# Patient Record
Sex: Male | Born: 1970 | Race: White | Hispanic: No | Marital: Married | Smoking: Current every day smoker
Health system: Southern US, Community
[De-identification: ages and names within clinical notes are randomized; demographics above are authoritative.]

## PROBLEM LIST (undated history)

## (undated) DIAGNOSIS — M1990 Unspecified osteoarthritis, unspecified site: Secondary | ICD-10-CM

## (undated) HISTORY — PX: NO PAST SURGERIES: SHX2092

---

## 2015-08-09 ENCOUNTER — Emergency Department (HOSPITAL_COMMUNITY): Payer: Worker's Compensation

## 2015-08-09 ENCOUNTER — Inpatient Hospital Stay (HOSPITAL_COMMUNITY): Payer: Worker's Compensation

## 2015-08-09 ENCOUNTER — Encounter (HOSPITAL_COMMUNITY): Payer: Self-pay | Admitting: Emergency Medicine

## 2015-08-09 ENCOUNTER — Inpatient Hospital Stay (HOSPITAL_COMMUNITY)
Admission: EM | Admit: 2015-08-09 | Discharge: 2015-08-11 | DRG: 544 | Disposition: A | Discharge status: Home Health | Payer: Worker's Compensation | Attending: Neurosurgery | Admitting: Neurosurgery

## 2015-08-09 DIAGNOSIS — Y9269 Other specified industrial and construction area as the place of occurrence of the external cause: Secondary | ICD-10-CM | POA: Diagnosis not present

## 2015-08-09 DIAGNOSIS — M4856XA Collapsed vertebra, not elsewhere classified, lumbar region, initial encounter for fracture: Principal | ICD-10-CM | POA: Diagnosis present

## 2015-08-09 DIAGNOSIS — W1789XA Other fall from one level to another, initial encounter: Secondary | ICD-10-CM | POA: Diagnosis present

## 2015-08-09 DIAGNOSIS — W19XXXA Unspecified fall, initial encounter: Secondary | ICD-10-CM

## 2015-08-09 DIAGNOSIS — S32000A Wedge compression fracture of unspecified lumbar vertebra, initial encounter for closed fracture: Secondary | ICD-10-CM

## 2015-08-09 DIAGNOSIS — S32010A Wedge compression fracture of first lumbar vertebra, initial encounter for closed fracture: Secondary | ICD-10-CM | POA: Diagnosis present

## 2015-08-09 DIAGNOSIS — Y990 Civilian activity done for income or pay: Secondary | ICD-10-CM | POA: Diagnosis not present

## 2015-08-09 HISTORY — DX: Unspecified osteoarthritis, unspecified site: M19.90

## 2015-08-09 LAB — I-STAT CHEM 8, ED
BUN: 9 mg/dL (ref 6–20)
CALCIUM ION: 1.11 mmol/L — AB (ref 1.12–1.23)
CHLORIDE: 99 mmol/L — AB (ref 101–111)
Creatinine, Ser: 0.9 mg/dL (ref 0.61–1.24)
GLUCOSE: 118 mg/dL — AB (ref 65–99)
HCT: 46 % (ref 39.0–52.0)
Hemoglobin: 15.6 g/dL (ref 13.0–17.0)
Potassium: 3.5 mmol/L (ref 3.5–5.1)
SODIUM: 139 mmol/L (ref 135–145)
TCO2: 25 mmol/L (ref 0–100)

## 2015-08-09 LAB — CBC WITH DIFFERENTIAL/PLATELET
Basophils Absolute: 0 10*3/uL (ref 0.0–0.1)
Basophils Relative: 0 %
EOS ABS: 0.1 10*3/uL (ref 0.0–0.7)
EOS PCT: 1 %
HCT: 42 % (ref 39.0–52.0)
HEMOGLOBIN: 14.3 g/dL (ref 13.0–17.0)
LYMPHS ABS: 2.9 10*3/uL (ref 0.7–4.0)
LYMPHS PCT: 34 %
MCH: 29 pg (ref 26.0–34.0)
MCHC: 34 g/dL (ref 30.0–36.0)
MCV: 85.2 fL (ref 78.0–100.0)
MONOS PCT: 5 %
Monocytes Absolute: 0.5 10*3/uL (ref 0.1–1.0)
Neutro Abs: 5.2 10*3/uL (ref 1.7–7.7)
Neutrophils Relative %: 60 %
PLATELETS: 250 10*3/uL (ref 150–400)
RBC: 4.93 MIL/uL (ref 4.22–5.81)
RDW: 13 % (ref 11.5–15.5)
WBC: 8.7 10*3/uL (ref 4.0–10.5)

## 2015-08-09 LAB — CREATININE, SERUM
Creatinine, Ser: 0.89 mg/dL (ref 0.61–1.24)
GFR calc Af Amer: >60 mL/min (ref >60)

## 2015-08-09 MED ORDER — HYDROMORPHONE HCL 1 MG/ML IJ SOLN
2.0000 mg | INTRAMUSCULAR | Status: AC
  Administered 2015-08-09: 2 mg via INTRAVENOUS
  Filled 2015-08-09: qty 2

## 2015-08-09 MED ORDER — HYDROMORPHONE HCL 1 MG/ML IJ SOLN: 1.0000 mg | Freq: Once | INTRAMUSCULAR | Status: DC

## 2015-08-09 MED ORDER — ONDANSETRON HCL 4 MG/2ML IJ SOLN
4.0000 mg | Freq: Once | INTRAMUSCULAR | Status: AC
  Administered 2015-08-09: 4 mg via INTRAVENOUS
  Filled 2015-08-09: qty 2

## 2015-08-09 MED ORDER — SODIUM CHLORIDE 0.9 % IJ SOLN: 3.0000 mL | INTRAMUSCULAR | Status: DC | PRN

## 2015-08-09 MED ORDER — DOCUSATE SODIUM 100 MG PO CAPS
100.0000 mg | ORAL_CAPSULE | Freq: Two times a day (BID) | ORAL | Status: DC
  Administered 2015-08-09 – 2015-08-11 (×4): 100 mg via ORAL
  Filled 2015-08-09 (×4): qty 1

## 2015-08-09 MED ORDER — DIAZEPAM 5 MG PO TABS
5.0000 mg | ORAL_TABLET | Freq: Four times a day (QID) | ORAL | Status: DC | PRN
  Administered 2015-08-10 – 2015-08-11 (×5): 5 mg via ORAL
  Filled 2015-08-09 (×5): qty 1

## 2015-08-09 MED ORDER — HYDROMORPHONE HCL 1 MG/ML IJ SOLN
2.0000 mg | Freq: Once | INTRAMUSCULAR | Status: AC
  Administered 2015-08-09: 2 mg via INTRAVENOUS
  Filled 2015-08-09: qty 2

## 2015-08-09 MED ORDER — ONDANSETRON HCL 4 MG/2ML IJ SOLN: 4.0000 mg | Freq: Four times a day (QID) | INTRAMUSCULAR | Status: DC | PRN

## 2015-08-09 MED ORDER — HYDROMORPHONE HCL 1 MG/ML IJ SOLN
1.0000 mg | Freq: Once | INTRAMUSCULAR | Status: AC
  Administered 2015-08-09: 1 mg via INTRAVENOUS
  Filled 2015-08-09: qty 1

## 2015-08-09 MED ORDER — HYDROMORPHONE HCL 1 MG/ML IJ SOLN
1.0000 mg | INTRAMUSCULAR | Status: DC | PRN
  Administered 2015-08-09 – 2015-08-11 (×6): 1 mg via INTRAVENOUS
  Filled 2015-08-09 (×6): qty 1

## 2015-08-09 MED ORDER — HEPARIN SODIUM (PORCINE) 5000 UNIT/ML IJ SOLN
5000.0000 [IU] | Freq: Three times a day (TID) | INTRAMUSCULAR | Status: DC
  Administered 2015-08-09 – 2015-08-11 (×6): 5000 [IU] via SUBCUTANEOUS
  Filled 2015-08-09 (×6): qty 1

## 2015-08-09 MED ORDER — IOHEXOL 300 MG/ML  SOLN
100.0000 mL | Freq: Once | INTRAMUSCULAR | Status: AC | PRN
  Administered 2015-08-09: 100 mL via INTRAVENOUS

## 2015-08-09 MED ORDER — MAGNESIUM CITRATE PO SOLN: 1.0000 | Freq: Once | ORAL | Status: DC | PRN

## 2015-08-09 MED ORDER — ACETAMINOPHEN 325 MG PO TABS: 650.0000 mg | ORAL_TABLET | Freq: Four times a day (QID) | ORAL | Status: DC | PRN

## 2015-08-09 MED ORDER — BISACODYL 5 MG PO TBEC: 5.0000 mg | DELAYED_RELEASE_TABLET | Freq: Every day | ORAL | Status: DC | PRN

## 2015-08-09 MED ORDER — ACETAMINOPHEN 650 MG RE SUPP: 650.0000 mg | Freq: Four times a day (QID) | RECTAL | Status: DC | PRN

## 2015-08-09 MED ORDER — SENNOSIDES-DOCUSATE SODIUM 8.6-50 MG PO TABS: 1.0000 | ORAL_TABLET | Freq: Every evening | ORAL | Status: DC | PRN

## 2015-08-09 MED ORDER — KETOROLAC TROMETHAMINE 30 MG/ML IJ SOLN
30.0000 mg | Freq: Four times a day (QID) | INTRAMUSCULAR | Status: DC
  Administered 2015-08-09 – 2015-08-11 (×8): 30 mg via INTRAVENOUS
  Filled 2015-08-09 (×9): qty 1

## 2015-08-09 MED ORDER — SODIUM CHLORIDE 0.9 % IJ SOLN
3.0000 mL | Freq: Two times a day (BID) | INTRAMUSCULAR | Status: DC
  Administered 2015-08-09 – 2015-08-10 (×2): 3 mL via INTRAVENOUS

## 2015-08-09 MED ORDER — SODIUM CHLORIDE 0.9 % IV SOLN: 250.0000 mL | INTRAVENOUS | Status: DC | PRN

## 2015-08-09 MED ORDER — ONDANSETRON HCL 4 MG PO TABS: 4.0000 mg | ORAL_TABLET | Freq: Four times a day (QID) | ORAL | Status: DC | PRN

## 2015-08-09 MED ORDER — OXYCODONE HCL 5 MG PO TABS
5.0000 mg | ORAL_TABLET | ORAL | Status: DC | PRN
  Administered 2015-08-09 – 2015-08-11 (×8): 5 mg via ORAL
  Filled 2015-08-09 (×8): qty 1

## 2015-08-09 MED ORDER — FAMOTIDINE 20 MG PO TABS
20.0000 mg | ORAL_TABLET | Freq: Every day | ORAL | Status: DC
  Administered 2015-08-09 – 2015-08-10 (×2): 20 mg via ORAL
  Filled 2015-08-09 (×2): qty 1

## 2015-08-09 MED ORDER — POTASSIUM CHLORIDE IN NACL 20-0.9 MEQ/L-% IV SOLN
INTRAVENOUS | Status: DC
  Administered 2015-08-09 – 2015-08-10 (×2): via INTRAVENOUS
  Filled 2015-08-09 (×3): qty 1000

## 2015-08-09 NOTE — Progress Notes (Signed)
Patient admitted to room 5M01 from ED at this time. Alert and in stable condition.

## 2015-08-09 NOTE — ED Notes (Signed)
Patient transported to CT 

## 2015-08-09 NOTE — ED Provider Notes (Signed)
CSN: [id number]     Arrival date & time 08/09/15  1327 History   First MD Initiated Contact with Patient 08/09/15 1323     Chief Complaint  Patient presents with  . Fall     (Consider location/radiation/quality/duration/timing/severity/associated sxs/prior Treatment) Patient is a 44 y.o. male presenting with fall. The history is provided by the patient.  Fall Pertinent negatives include no chest pain and no abdominal pain.   patient came in after fall. 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 Reportedly get hit by a beam moving across that knocked him off ladder Landed on his lower back on a horizontal bar. Severe pain in his lower back. States did not his head. No neck pain. No numbness weakness. The pain is severe. No loss of bladder or bowel control. He has had some mild back pains in the past but nothing like this. He is not on anticoagulation.  Past Medical History  Diagnosis Date  . Arthritis    History reviewed. No pertinent past surgical history. No family history on file. Social History  Substance Use Topics  . Smoking status: None  . Smokeless tobacco: None  . Alcohol Use: None    Review of Systems  Constitutional: Negative for appetite change.  Cardiovascular: Negative for chest pain.  Gastrointestinal: Negative for abdominal pain.  Genitourinary: Negative for flank pain.  Musculoskeletal: Positive for back pain.  Skin: Negative for wound.  Neurological: Negative for weakness and numbness.      Allergies  Review of patient's allergies indicates no known allergies.  Home Medications   Prior to Admission medications   Not on File   BP 148/102 mmHg  Pulse 67  Temp(Src) 97.9 F (36.6 C) (Oral)  Resp 27  Ht 5\' 7"  (1.702 m)  Wt 155 lb (70.308 kg)  BMI 24.27 kg/m2  SpO2 100% Physical Exam  Constitutional: He appears well-developed.  HENT:  Head: Atraumatic.  Cardiovascular: Normal rate.   Pulmonary/Chest: Effort  normal.  Abdominal: Soft.  Musculoskeletal: Normal range of motion. He exhibits no edema.   No cervical spine tenderness. Painless range of motion. Moderate-to-severe tenderness over lower back. Good strength in both lower legs. Pain in lower back with movement of the legs.  Skin: Skin is warm.    ED Course  Procedures (including critical care time) Labs Review Labs Reviewed - No data to display  Imaging Review No results found. I have personally reviewed and evaluated these images and lab results as part of my medical decision-making.   EKG Interpretation None      MDM   Final diagnoses:  None     patient with fall. Landed on his lower back on the floor. Has apparent 33% lumbar compression fracture and some retropulsion. Neurologically is able to move all his extremities. He does have pain in the back with it. Perineal sensations intact. Good flexion-extension of the ankle and knees. Discussed with or nurse for Dr. Franky Machoabbell.   I will add on CT scans of the cervical spine and thoracic spine stenoses were not imaged.. Will be seen by Dr. Mikal Planeabell in the ER. Patient is nothing by mouth and has not eaten today.    Benjiman CoreNathan Sherrika Weakland, MD 08/09/15 1540

## 2015-08-09 NOTE — ED Notes (Signed)
Patient undressed, in gown, on monitor, continuous pulse oximetry and blood pressure cuff 

## 2015-08-09 NOTE — ED Notes (Signed)
Per EMS, pt was on a 6 foot ladder when he fell onto a horizontal circular beam striking his lower back. Pt alert x4.

## 2015-08-09 NOTE — H&P (Signed)
Jesse Burns is an 44 y.o. male.   Chief Complaint: back pain, l1 compression fracture HPI: whom was knocked off a ladder while at work, striking a steel beam on his way down. Complained of back pain immediately and brought to the ED. CT showed an L1 compression fracture with minimal retropulsion. Neurologically no deficits were noted.  Past Medical History  Diagnosis Date  . Arthritis     History reviewed. No pertinent past surgical history.  No family history on file. Social History:  has no tobacco, alcohol, and drug history on file.  Allergies: No Known Allergies   (Not in a hospital admission)  Results for orders placed or performed during the hospital encounter of 08/09/15 (from the past 48 hour(s))  CBC with Differential     Status: None   Collection Time: 08/09/15  4:02 PM  Result Value Ref Range   WBC 8.7 4.0 - 10.5 K/uL   RBC 4.93 4.22 - 5.81 MIL/uL   Hemoglobin 14.3 13.0 - 17.0 g/dL   HCT 16.1 09.6 - 04.5 %   MCV 85.2 78.0 - 100.0 fL   MCH 29.0 26.0 - 34.0 pg   MCHC 34.0 30.0 - 36.0 g/dL   RDW 40.9 81.1 - 91.4 %   Platelets 250 150 - 400 K/uL   Neutrophils Relative % 60 %   Neutro Abs 5.2 1.7 - 7.7 K/uL   Lymphocytes Relative 34 %   Lymphs Abs 2.9 0.7 - 4.0 K/uL   Monocytes Relative 5 %   Monocytes Absolute 0.5 0.1 - 1.0 K/uL   Eosinophils Relative 1 %   Eosinophils Absolute 0.1 0.0 - 0.7 K/uL   Basophils Relative 0 %   Basophils Absolute 0.0 0.0 - 0.1 K/uL  I-stat Chem 8, ED     Status: Abnormal   Collection Time: 08/09/15  4:05 PM  Result Value Ref Range   Sodium 139 135 - 145 mmol/L   Potassium 3.5 3.5 - 5.1 mmol/L   Chloride 99 (L) 101 - 111 mmol/L   BUN 9 6 - 20 mg/dL   Creatinine, Ser 7.82 0.61 - 1.24 mg/dL   Glucose, Bld 956 (H) 65 - 99 mg/dL   Calcium, Ion 2.13 (L) 1.12 - 1.23 mmol/L   TCO2 25 0 - 100 mmol/L   Hemoglobin 15.6 13.0 - 17.0 g/dL   HCT 08.6 57.8 - 46.9 %   Ct Cervical Spine Wo Contrast  08/09/2015  CLINICAL DATA:  44 year old  who was standing on a ladder approximately 6 feet off of the ground, struck by a bean which caused him to fall off of the ladder, landing on his lower back on a horizontal bar. Acute L1 compression fracture on earlier lumbar spine CT. EXAM: CT CERVICAL SPINE WITHOUT CONTRAST CT THORACIC SPINE WITHOUT CONTRAST TECHNIQUE: Multidetector CT imaging of the cervical and thoracic spine was performed without contrast. Multiplanar CT image reconstructions were also generated. COMPARISON:  None. FINDINGS: CT CERVICAL SPINE FINDINGS No fractures identified involving the cervical spine. Sagittal reconstructed images demonstrate anatomic alignment. Mild disc space narrowing at C4-5 with mild central disc protrusion. Minimal central disc protrusion at C3-4 and C5-6. Facet joints intact throughout. No spinal stenosis. Coronal reformatted images demonstrate an intact craniocervical junction, intact C1-C2 articulation, intact dens, and intact lateral masses throughout. Uncinate hypertrophy accounts for moderate bilateral foraminal stenoses at C4-5, with the remaining neural foramina widely patent. CT THORACIC SPINE FINDINGS No fractures identified involving the thoracic spine. Several reconstructed images demonstrate anatomic alignment. Mild disc  space narrowing at multiple levels of the lower thoracic spine. Calcification in the anterior annular fibers at T9-10 and T11-12. No spinal stenosis. Likely postsurgical dystrophic calcification involving the posterior mediastinum on the left, posterior to the esophagus. 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 No acute fractures identified involving the visualized posterior ribs. IMPRESSION: 1. No acute fractures identified involving the cervical or thoracic spine. 2. 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 No acute fractures identified involving the visualized posterior ribs. Electronically Signed   By: Hulan Saas M.D.   On: 08/09/2015 17:07   Ct Thoracic Spine Wo Contrast  08/09/2015  CLINICAL DATA:  44 year old who was standing on a ladder approximately 6 feet off of the ground, struck by a bean which caused him to fall off of the ladder, landing on his lower back on a horizontal bar. Acute L1 compression fracture on earlier lumbar spine CT. EXAM: CT CERVICAL SPINE WITHOUT CONTRAST CT THORACIC SPINE WITHOUT CONTRAST TECHNIQUE: Multidetector CT imaging of the cervical and thoracic spine was performed without contrast. Multiplanar CT image reconstructions were also generated. COMPARISON:  None. FINDINGS: CT CERVICAL SPINE FINDINGS No fractures identified involving the cervical spine. Sagittal reconstructed images demonstrate anatomic alignment. Mild disc space narrowing at C4-5 with mild central disc protrusion. Minimal central disc protrusion at C3-4 and C5-6. Facet joints intact throughout. No spinal stenosis. Coronal reformatted images demonstrate an intact craniocervical junction, intact C1-C2 articulation, intact dens, and intact lateral masses throughout. Uncinate hypertrophy accounts for moderate bilateral foraminal stenoses at C4-5, with the remaining neural foramina widely patent. CT THORACIC SPINE FINDINGS No fractures identified involving the thoracic spine. Several reconstructed images demonstrate anatomic alignment. Mild disc space narrowing at multiple levels of the lower thoracic spine. Calcification in the anterior annular fibers at T9-10 and T11-12. No spinal stenosis. Likely postsurgical dystrophic calcification involving the posterior mediastinum on the left, posterior to the esophagus. 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 No acute fractures identified involving the visualized posterior ribs. IMPRESSION: 1. No acute fractures identified involving the cervical or thoracic spine. 2. Old likely old thoracotomy deformity involving the posterior left seventh  rib. No acute fractures identified involving the visualized posterior ribs. Electronically Signed   By: Hulan Saas M.D.   On: 08/09/2015 17:07   Ct Lumbar Spine Wo Contrast  08/09/2015  CLINICAL DATA:  44 year old male status post 6 foot fall landing on a steel beam. Severe lumbar spine pain. EXAM: CT LUMBAR SPINE WITHOUT CONTRAST TECHNIQUE: Multidetector CT imaging of the lumbar spine was performed without intravenous contrast administration. Multiplanar CT image reconstructions were also generated. COMPARISON:  Concurrently obtained pelvic x-ray Ing FINDINGS: There is an acute compression fracture involving the superior endplate of L1 with approximately 33% height loss anteriorly. There is mild posterior bony retropulsion of approximately 4 mm. This results in some effacement of the anterior CSF space. No evidence of posterior element involvement or fracture at any additional level. Mild perivertebral hematoma. No significant degenerative disc disease or facet arthropathy. The visualized intra-abdominal and pelvic contents are unremarkable. IMPRESSION: Positive for acute compression fracture of the superior endplate of L1 resulting in approximately 33% height loss anteriorly and with posterior bony retropulsion of approximately 4 mm. Electronically Signed   By: Malachy Moan M.D.   On: 08/09/2015 15:10   Dg Pelvis Portable  08/09/2015  CLINICAL DATA:  Fall off of an 8 foot ladder landing on a metal rod. Lower  lumbar and upper pelvic pain posteriorly. Initial encounter. EXAM: PORTABLE PELVIS 1-2 VIEWS COMPARISON:  None. FINDINGS: There is no evidence of pelvic fracture or diastasis. No pelvic bone lesions are seen. IMPRESSION: Negative. Electronically Signed   By: Sebastian AcheAllen  Grady M.D.   On: 08/09/2015 13:56   Dg Chest Portable 1 View  08/09/2015  CLINICAL DATA:  44 year old who was standing on a ladder approximately 6 feet off the ground, struck by a beam which caused him to fall off of the  ladder, landing on his lower back on a horizontal bar. Severe low back pain with acute L1 compression fracture on CT. Mild shortness of breath. Initial encounter. EXAM: PORTABLE CHEST 1 VIEW COMPARISON:  None. FINDINGS: Cardiomediastinal silhouette unremarkable for AP portable technique. Elevation of right hemidiaphragm with linear scar/atelectasis at the right lung base. Mild linear atelectasis at the left lung base. Lungs otherwise clear. No localized airspace consolidation. No pleural effusions. No pneumothorax. Normal pulmonary vascularity. IMPRESSION: Elevation of the right hemidiaphragm with scar/atelectasis at the right lung base. Mild linear atelectasis at the left base. No acute cardiopulmonary disease otherwise. A Electronically Signed   By: Hulan Saashomas  Lawrence M.D.   On: 08/09/2015 16:04    Review of Systems  Constitutional: Negative.   HENT: Negative.   Eyes: Negative.   Respiratory: Negative.   Cardiovascular: Negative.   Gastrointestinal: Negative.   Genitourinary: Negative.   Musculoskeletal: Positive for back pain.  Skin: Negative.   Neurological: Negative.   Endo/Heme/Allergies: Negative.   Psychiatric/Behavioral: Negative.     Blood pressure 129/88, pulse 84, temperature 97.9 F (36.6 C), temperature source Oral, resp. rate 17, height 5\' 7"  (1.702 m), weight 70.308 kg (155 lb), SpO2 95 %. Physical Exam  Constitutional: He is oriented to person, place, and time. He appears well-developed and well-nourished. He appears distressed.  HENT:  Head: Normocephalic and atraumatic.  Poor dentition  Eyes: Conjunctivae and EOM are normal. Pupils are equal, round, and reactive to light.  Neck: Normal range of motion. Neck supple.  Cardiovascular: Normal rate, regular rhythm, normal heart sounds and intact distal pulses.   Respiratory: Effort normal and breath sounds normal.  GI: There is tenderness.  Abdominal wall musculature spasming  Musculoskeletal: Normal range of motion. He  exhibits tenderness.  Neurological: He is alert and oriented to person, place, and time. He has normal reflexes. He displays normal reflexes. No cranial nerve deficit. He exhibits normal muscle tone. Coordination normal.  Skin: Skin is warm and dry.  Psychiatric: He has a normal mood and affect. His behavior is normal. Judgment and thought content normal.     Assessment/Plan L1 compression fracture without neurologic compromise Admit for pain control.  ED ordered ct abdomen secondary to abdominal wall tenderness.   Corynn Solberg L 08/09/2015, 5:26 PM

## 2015-08-09 NOTE — ED Provider Notes (Signed)
Assumed care from Dr. Rubin PayorPickering. Patient fell off of a ladder about 6 feet off the ground and hit by a beam in his low back. Has a compression fracture of his lumbar spine. Awaiting neurosurgery evaluation. No weakness in his legs.   Dr. Mikal Planeabell has seen patient. He'll admit for pain control. Patient with significant amount of abdominal tenderness on exam. Given his mechanism would pursue additional trauma imaging.  These were negative for additional traumatic pathology.  Jesse OctaveStephen Katerina Zurn, MD 08/09/15 [phone]

## 2015-08-10 ENCOUNTER — Encounter (HOSPITAL_COMMUNITY): Payer: Self-pay | Admitting: *Deleted

## 2015-08-10 MED ORDER — OXYCODONE-ACETAMINOPHEN 5-325 MG PO TABS: 1.0000 | ORAL_TABLET | Freq: Four times a day (QID) | ORAL | Status: AC | PRN

## 2015-08-10 MED ORDER — CYCLOBENZAPRINE HCL 10 MG PO TABS: 10.0000 mg | ORAL_TABLET | Freq: Three times a day (TID) | ORAL | Status: AC | PRN

## 2015-08-10 MED ORDER — DM-GUAIFENESIN ER 30-600 MG PO TB12
1.0000 | ORAL_TABLET | Freq: Two times a day (BID) | ORAL | Status: DC
  Administered 2015-08-10 – 2015-08-11 (×2): 1 via ORAL
  Filled 2015-08-10 (×2): qty 1

## 2015-08-10 NOTE — Discharge Instructions (Addendum)
Lumbar Fracture A lumbar fracture is a break in one of the bones of the lower back. Lumbar fractures range in severity. Severe fractures can damage the spinal cord. CAUSES This condition may be caused by:  A fall (common).  A car accident (common).  A gunshot wound.  A hard, direct hit to the back.  Osteoporosis. SYMPTOMS The main symptom of this condition is severe pain in the lower back. If a fracture is complex or severe, there may also be:  A misshapen or swollen area on the lower back.  A limited ability to move an area of the lower back.  An inability to empty the bladder or bowel.  A loss of strength or sensation in the legs, feet, and toes.  Paralysis. DIAGNOSIS This condition is diagnosed based on:  A physical exam.  Symptoms and what happened just before they developed.  The results of imaging tests, such as an X-ray, CT scan, or MRI. If your nerves have been damaged, you may also have other tests to find out how much damage there is. TREATMENT Treatment for this condition depends on the specifics of the injury. Most fractures can be treated with:  A back brace.  Bed rest and activity restrictions.  Pain medicine.  Physical therapy. Fractures that are complex, involve multiple bones, or make the spine unstable may require surgery to remove pressure from the nerves or spinal cord and to stabilize the broken pieces of bone. During recovery, it is normal to have pain and stiffness in the back for weeks. HOME CARE INSTRUCTIONS Medicines  Take medicines only as directed by your health care provider.  Do not drive or operate heavy machinery while taking pain medicine. Activity  Stay in bed for as long as directed by your health care provider.  If you were shown how to do any exercises to improve motion and strength in your back, do them as directed by your health care provider.  Return to your normal activities as directed by your health care provider.  Ask your health care provider what activities are safe for you. General Instructions  If you were given a neck brace or back brace, wear it as directed by your health care provider.  Keep all follow-up visits as directed by your health care provider. This is important. Failure to follow-up as recommended could result in permanent injury, disability, and long-lasting (chronic) pain. SEEK MEDICAL CARE IF:  Your pain does not improve over time.  You have a persistent cough.  You cannot return to your normal activities as planned or expected. SEEK IMMEDIATE MEDICAL CARE IF:  You have severe pain or your pain suddenly gets worse.  You are unable to move.  You have numbness, tingling, weakness, or paralysis in any part of your body.  You cannot control your bladder or bowel.  You have difficulty breathing.  You have a fever.  You have pain in your chest or abdomen.  You vomit.   This information is not intended to replace advice given to you by your health care provider. Make sure you discuss any questions you have with your health care provider.   Document Released: 11/22/2006 Document Revised: 12/22/2014 Document Reviewed: 08/03/2014 Elsevier Interactive Patient Education 2016 [employer].  Constipation, Adult Constipation is when a person has fewer than three bowel movements a week, has difficulty having a bowel movement, or has stools that are dry, hard, or larger than normal. As people grow older, constipation is more common. A low-fiber diet,  not taking in enough fluids, and taking certain medicines may make constipation worse.  CAUSES   Certain medicines, such as antidepressants, pain medicine, iron supplements, antacids, and water pills.   Certain diseases, such as diabetes, irritable bowel syndrome (IBS), thyroid disease, or depression.   Not drinking enough water.   Not eating enough fiber-rich foods.   Stress or travel.   Lack of physical activity or  exercise.   Ignoring the urge to have a bowel movement.   Using laxatives too much.  SIGNS AND SYMPTOMS   Having fewer than three bowel movements a week.   Straining to have a bowel movement.   Having stools that are hard, dry, or larger than normal.   Feeling full or bloated.   Pain in the lower abdomen.   Not feeling relief after having a bowel movement.  DIAGNOSIS  Your health care provider will take a medical history and perform a physical exam. Further testing may be done for severe constipation. Some tests may include:  A barium enema X-ray to examine your rectum, colon, and, sometimes, your small intestine.   A sigmoidoscopy to examine your lower colon.   A colonoscopy to examine your entire colon. TREATMENT  Treatment will depend on the severity of your constipation and what is causing it. Some dietary treatments include drinking more fluids and eating more fiber-rich foods. Lifestyle treatments may include regular exercise. If these diet and lifestyle recommendations do not help, your health care provider may recommend taking over-the-counter laxative medicines to help you have bowel movements. Prescription medicines may be prescribed if over-the-counter medicines do not work.  HOME CARE INSTRUCTIONS   Eat foods that have a lot of fiber, such as fruits, vegetables, whole grains, and beans.  Limit foods high in fat and processed sugars, such as french fries, hamburgers, cookies, candies, and soda.   A fiber supplement may be added to your diet if you cannot get enough fiber from foods.   Drink enough fluids to keep your urine clear or pale yellow.   Exercise regularly or as directed by your health care provider.   Go to the restroom when you have the urge to go. Do not hold it.   Only take over-the-counter or prescription medicines as directed by your health care provider. Do not take other medicines for constipation without talking to your health  care provider first.  SEEK IMMEDIATE MEDICAL CARE IF:   You have bright red blood in your stool.   Your constipation lasts for more than 4 days or gets worse.   You have abdominal or rectal pain.   You have thin, pencil-like stools.   You have unexplained weight loss. MAKE SURE YOU:   Understand these instructions.  Will watch your condition.  Will get help right away if you are not doing well or get worse.   This information is not intended to replace advice given to you by your health care provider. Make sure you discuss any questions you have with your health care provider.   Document Released: 05/05/2004 Document Revised: 08/28/2014 Document Reviewed: 05/19/2013 Elsevier Interactive Patient Education 2016 Elsevier Inc[employer]  Acetaminophen; Oxycodone tablets What is this medicine? ACETAMINOPHEN; OXYCODONE (a set a MEE noe fen; ox i KOE done) is a pain reliever. It is used to treat moderate to severe pain. This medicine may be used for other purposes; ask your health care provider or pharmacist if you have questions. What should I tell my health care provider before I take  this medicine? They need to know if you have any of these conditions: -brain tumor -Crohn's disease, inflammatory bowel disease, or ulcerative colitis -drug abuse or addiction -head injury -heart or circulation problems -if you often drink alcohol -kidney disease or problems going to the bathroom -liver disease -lung disease, asthma, or breathing problems -an unusual or allergic reaction to acetaminophen, oxycodone, other opioid analgesics, other medicines, foods, dyes, or preservatives -pregnant or trying to get pregnant -breast-feeding How should I use this medicine? Take this medicine by mouth with a full glass of water. Follow the directions on the prescription label. You can take it with or without food. If it upsets your stomach, take it with food. Take your medicine at regular intervals. Do not  take it more often than directed. Talk to your pediatrician regarding the use of this medicine in children. Special care may be needed. Patients over 61 years old may have a stronger reaction and need a smaller dose. Overdosage: If you think you have taken too much of this medicine contact a poison control center or emergency room at once. NOTE: This medicine is only for you. Do not share this medicine with others. What if I miss a dose? If you miss a dose, take it as soon as you can. If it is almost time for your next dose, take only that dose. Do not take double or extra doses. What may interact with this medicine? -alcohol -antihistamines -barbiturates like amobarbital, butalbital, butabarbital, methohexital, pentobarbital, phenobarbital, thiopental, and secobarbital -benztropine -drugs for bladder problems like solifenacin, trospium, oxybutynin, tolterodine, hyoscyamine, and methscopolamine -drugs for breathing problems like ipratropium and tiotropium -drugs for certain stomach or intestine problems like propantheline, homatropine methylbromide, glycopyrrolate, atropine, belladonna, and dicyclomine -general anesthetics like etomidate, ketamine, nitrous oxide, propofol, desflurane, enflurane, halothane, isoflurane, and sevoflurane -medicines for depression, anxiety, or psychotic disturbances -medicines for sleep -muscle relaxants -naltrexone -narcotic medicines (opiates) for pain -phenothiazines like perphenazine, thioridazine, chlorpromazine, mesoridazine, fluphenazine, prochlorperazine, promazine, and trifluoperazine -scopolamine -tramadol -trihexyphenidyl This list may not describe all possible interactions. Give your health care provider a list of all the medicines, herbs, non-prescription drugs, or dietary supplements you use. Also tell them if you smoke, drink alcohol, or use illegal drugs. Some items may interact with your medicine. What should I watch for while using this  medicine? Tell your doctor or health care professional if your pain does not go away, if it gets worse, or if you have new or a different type of pain. You may develop tolerance to the medicine. Tolerance means that you will need a higher dose of the medication for pain relief. Tolerance is normal and is expected if you take this medicine for a long time. Do not suddenly stop taking your medicine because you may develop a severe reaction. Your body becomes used to the medicine. This does NOT mean you are addicted. Addiction is a behavior related to getting and using a drug for a non-medical reason. If you have pain, you have a medical reason to take pain medicine. Your doctor will tell you how much medicine to take. If your doctor wants you to stop the medicine, the dose will be slowly lowered over time to avoid any side effects. You may get drowsy or dizzy. Do not drive, use machinery, or do anything that needs mental alertness until you know how this medicine affects you. Do not stand or sit up quickly, especially if you are an older patient. This reduces the risk of dizzy or fainting  spells. Alcohol may interfere with the effect of this medicine. Avoid alcoholic drinks. There are different types of narcotic medicines (opiates) for pain. If you take more than one type at the same time, you may have more side effects. Give your health care provider a list of all medicines you use. Your doctor will tell you how much medicine to take. Do not take more medicine than directed. Call emergency for help if you have problems breathing. The medicine will cause constipation. Try to have a bowel movement at least every 2 to 3 days. If you do not have a bowel movement for 3 days, call your doctor or health care professional. Do not take Tylenol (acetaminophen) or medicines that have acetaminophen with this medicine. Too much acetaminophen can be very dangerous. Many nonprescription medicines contain acetaminophen. Always  read the labels carefully to avoid taking more acetaminophen. What side effects may I notice from receiving this medicine? Side effects that you should report to your doctor or health care professional as soon as possible: -allergic reactions like skin rash, itching or hives, swelling of the face, lips, or tongue -breathing difficulties, wheezing -confusion -light headedness or fainting spells -severe stomach pain -unusually weak or tired -yellowing of the skin or the whites of the eyes Side effects that usually do not require medical attention (report to your doctor or health care professional if they continue or are bothersome): -dizziness -drowsiness -nausea -vomiting This list may not describe all possible side effects. Call your doctor for medical advice about side effects. You may report side effects to FDA at 1-800-FDA-1088. Where should I keep my medicine? Keep out of the reach of children. This medicine can be abused. Keep your medicine in a safe place to protect it from theft. Do not share this medicine with anyone. Selling or giving away this medicine is dangerous and against the law. This medicine may cause accidental overdose and death if it taken by other adults, children, or pets. Mix any unused medicine with a substance like cat litter or coffee grounds. Then throw the medicine away in a sealed container like a sealed bag or a coffee can with a lid. Do not use the medicine after the expiration date. Store at room temperature between 20 and 25 degrees C (68 and 77 degrees F). NOTE: This sheet is a summary. It may not cover all possible information. If you have questions about this medicine, talk to your doctor, pharmacist, or health care provider.    2016, Elsevier/Gold Standard. (2014-07-08 15:18:46)

## 2015-08-10 NOTE — Discharge Summary (Signed)
  Physician Discharge Summary  Patient ID: Jesse Burns MRN: [id number] DOB/AGE: March 27, 1971 44 y.o.  Admit date: 08/09/2015 Discharge date: 08/10/2015  Admission Diagnoses: Jesse Burns, Jesse Burns Discharge Diagnoses:  Active Problems:   Compression Burns of first lumbar vertebra [facility](HCC)   Discharged Condition: good  Hospital Course: Jesse Burns was admitted after falling while at work yesterday resulting in a compression Burns of Jesse. He has some mild retropulsion, the posterior elements are intact. He was in significant pain and was admitted for pain control. He is doing well with oral medication for pain. He is voiding, and tolerating a regular diet. He will be sent home with a brace.   Treatments: bracing  Discharge Exam: Blood pressure 123/78, pulse 98, temperature 99.8 F (37.7 C), temperature source Oral, resp. rate 20, height 5\' 7"  (1.702 m), weight 70.308 kg (155 lb), SpO2 96 %. General appearance: alert, cooperative, appears stated age and mild distress Neurologic: Alert and oriented X 3, normal strength and tone. Normal symmetric reflexes. Normal coordination and gait  Disposition: Final discharge disposition not confirmed * Jesse surgery found *    Medication List    STOP taking these medications        traMADol 50 MG tablet  Commonly known as:  ULTRAM      TAKE these medications        cyclobenzaprine 10 MG tablet  Commonly known as:  FLEXERIL  Take 1 tablet (10 mg total) by mouth 3 (three) times daily as needed for muscle spasms.     HYDROcodone-acetaminophen 5-325 MG tablet  Commonly known as:  NORCO/VICODIN  Take 1 tablet by mouth every 6 (six) hours as needed for moderate pain.     oxyCODONE-acetaminophen 5-325 MG tablet  Commonly known as:  ROXICET  Take 1 tablet by mouth every 6 (six) hours as needed for severe pain.     ranitidine 150 MG tablet  Commonly known as:  ZANTAC  Take 150 mg by mouth daily as needed for  heartburn.           Follow-up Information    Follow up with Jesse Maselli Burns, Jesse Burns In 3 weeks.   Specialty:  Neurosurgery   Why:  call the office please to make an appointment   Contact information:   [address] [place] [address] [phone]       Signed: Carmela Burns,Jesse Burns 08/10/2015, 7:18 PM

## 2015-08-10 NOTE — Progress Notes (Signed)
Patient ID: Jesse GravesChris Alpern, male   DOB: [birth year], 44 y.o.   MRN: [id number] BP 123/78 mmHg  Pulse 98  Temp(Src) 99.8 F (37.7 C) (Oral)  Resp 20  Ht 5\' 7"  (1.702 m)  Wt 70.308 kg (155 lb)  BMI 24.27 kg/m2  SpO2 96% Still with a great deal of pain. Has needed dilaudid this afternoon for pain control. Will not discharge tonight. Pt can work with him tomorrow with the brace on.  Moving lower extremities well.

## 2015-08-10 NOTE — Progress Notes (Signed)
Utilization review completed. Baylee Mccorkel, RN, BSN. 

## 2015-08-11 ENCOUNTER — Encounter (HOSPITAL_COMMUNITY): Payer: Self-pay | Admitting: *Deleted

## 2015-08-11 NOTE — Progress Notes (Signed)
RN discussed discharge instructions with patient. RN discussed constipation preventions, pain medication and pain control, follow up appointments, and brace use. Patient and family vocalized understanding. Per patient, Dr. Franky Machoabbell gave him prescribed prescriptions. Patient rates pain a 6/10 in back, states this is manageable. Neuro assessment unchanged. IV removed. Patient escorted down to emergency room exit by StrathmoreNickie, NT.

## 2015-08-11 NOTE — Progress Notes (Addendum)
[employer] paged to order aspen lumbar with thoracic extension. [employer] to deliver brace.

## 2015-08-11 NOTE — Progress Notes (Signed)
Orthopedic Tech Progress Note Patient Details:  Jesse GravesChris Burns Sep 04, 1970 [id number]  Patient ID: Jesse GravesChris Lavergne, male   DOB: Sep 04, 1970, 44 y.o.   MRN: [id number] Called in [employer] brace order; spoke with Richardean Chimeraathy  [name] 08/11/2015, 9:10 AM

## 2015-08-11 NOTE — Evaluation (Signed)
Physical Therapy Evaluation Patient Details Name: Jesse GravesChris Treichler MRN: [id number] DOB: 04/07/1971 Today's Date: 08/11/2015   History of Present Illness  whom was knocked off a ladder while at work, striking a steel beam on his way down. Complained of back pain immediately and brought to the ED. CT showed an L1 compression fracture with minimal retropulsion  Clinical Impression  Patient presents with decreased mobility due to deficits listed in PT problem list.  He will benefit from skilled PT in the acute setting to allow return home with family support and HHPT follow up.    Follow Up Recommendations Home health PT    Equipment Recommendations  Rolling walker with 5" wheels;Hospital bed (reports bed at home too high to get in safely)    Recommendations for Other Services       Precautions / Restrictions Precautions Precautions: Fall;Back Precaution Booklet Issued: No Precaution Comments: Educated pt and wife  Required Braces or Orthoses: Spinal Brace Spinal Brace: Thoracolumbosacral orthotic;Other (comment) Spinal Brace Comments: on when I entered room      Mobility  Bed Mobility               General bed mobility comments: seated on EOB, reports wife assisted him up  Transfers Overall transfer level: Needs assistance Equipment used: Rolling walker (2 wheeled) Transfers: Sit to/from Stand Sit to Stand: Supervision;Min guard         General transfer comment: up from EOB pulls up on walker  Ambulation/Gait Ambulation/Gait assistance: Min guard;Supervision Ambulation Distance (Feet): 200 Feet Assistive device: Rolling walker (2 wheeled) Gait Pattern/deviations: Step-through pattern;Trunk flexed;Decreased stride length     General Gait Details: decreased foot clearance; stepped over side of tub with walker using walker and min A, cues for technique  Stairs            Wheelchair Mobility    Modified Rankin (Stroke Patients Only)       Balance  Overall balance assessment: Needs assistance   Sitting balance-Leahy Scale: Good       Standing balance-Leahy Scale: Fair Standing balance comment: can stand without UE support, but walker needed for ambulation due to pain                             Pertinent Vitals/Pain Pain Assessment: 0-10 Pain Score: 7  Pain Location: back, and generalized aching Pain Descriptors / Indicators: Aching;Sore Pain Intervention(s): Monitored during session;Repositioned;Patient requesting pain meds-RN notified;RN gave pain meds during session    Home Living Family/patient expects to be discharged to:: Private residence Living Arrangements: Spouse/significant other Available Help at Discharge: Family;Available 24 hours/day Type of Home: House Home Access: Stairs to enter   [employer] Corporation[employer]trance Stairs-Number of Steps: 1 Home Layout: One level Home Equipment: None      Prior Function Level of Independence: Independent               Hand Dominance        Extremity/Trunk Assessment               Lower Extremity Assessment: Overall WFL for tasks assessed         Communication   Communication: No difficulties  Cognition Arousal/Alertness: Awake/alert Behavior During Therapy: WFL for tasks assessed/performed Overall Cognitive Status: Within Functional Limits for tasks assessed                      General Comments General comments (skin integrity, edema, etc.): brace  donned by [employer] rep prior to session    Exercises        Assessment/Plan    PT Assessment Patient needs continued PT services  PT Diagnosis Difficulty walking;Acute pain   PT Problem List Decreased activity tolerance;Decreased knowledge of use of DME;Decreased knowledge of precautions;Decreased mobility;Pain  PT Treatment Interventions DME instruction;Balance training;Gait training;Functional mobility training;Patient/family education;Therapeutic activities;Therapeutic exercise   PT Goals  (Current goals can be found in the Care Plan section) Acute Rehab PT Goals Patient Stated Goal: To reutrn home today PT Goal Formulation: With patient/family Time For Goal Achievement: 08/13/15 Potential to Achieve Goals: Good    Frequency Min 3X/week   Barriers to discharge        Co-evaluation               End of Session Equipment Utilized During Treatment: Back brace Activity Tolerance: Patient limited by pain Patient left: in chair;with call bell/phone within reach Nurse Communication: Patient requests pain meds         Time: 1102-1130 PT Time Calculation (min) (ACUTE ONLY): 28 min   Charges:   PT Evaluation $Initial PT Evaluation Tier I: 1 Procedure PT Treatments $Gait Training: 8-22 mins   PT G CodesElray Mcgregor 08/22/15, 12:21 PM  Sheran Lawless, PT [phone] 08/22/15

## 2015-08-12 NOTE — Care Management Note (Signed)
Case Management Note  Patient Details  Name: Octavio GravesChris Lile MRN: [id number] Date of Birth: Mar 21, 1971  Subjective/Objective:   Patient admitted with a fall and a lumbar fracture. Patient is from home with his spouse.                  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 CM spoke to Peter Kiewit SonsCarolyn Fix with [employer] and provided her with the information she needed and found that [employer] would be the company helping the patient with his home needs. CM spoke with [employer] and went over the recommendations from PT for home. [employer] asked that the patient be provided a walk[facility] and they would reimburse them so that patient would have equipment for the trip home. [employer] stated that patient would not be able to have hospital bed to his home tonight but would make an effort to have it delivered 12/22. Patient informed of bed not being available today and he states he would be ok to go home without it today. CM notified Dr Franky Machoabbell of need for orders for home DME and home health so that [employer] could get things arranged for the patient. Orders placed after hours so information faxed to [employer] this am with successful transmission report. CM also left message for Eber JonesCarolyn to see if she needed any of the discharge information. CM will continue to follow up today to make sure [employer] has information/ orders needed for the patient.   Expected Discharge Date:                  Expected Discharge Plan:  Home w Home Health Services  In-House Referral:     Discharge planning Services  CM Consult  Post Acute Care Choice:  Durable Medical Equipment, Home Health Choice offered to:  Patient (choice goes through [employer].)  DME Arranged:  Dan HumphreysWalker rolling, Hospital bed DME Agency:  [facility] ([facility] and [employer] is delivering the bed)  HH Arranged:  PT HH Agency:   (home  health agencies is decided by [employer]/ [employer])  Status of Service:  Completed, signed off  Medicare Important Message Given:    Date Medicare IM Given:    Medicare IM give by:    Date Additional Medicare IM Given:    Additional Medicare Important Message give by:     If discussed at Long Length of Stay Meetings, dates discussed:    Additional Comments:  Kermit BaloKelli F Kaleel Schmieder, RN 08/12/2015, 9:49 AM

## 2017-02-20 IMAGING — CT CT CHEST W/ CM
1 series · 14 of 33 positions shown, 18 images · IV contrast (omnipaque)
Comparison: None.

CLINICAL DATA: 44-year-old who was standing on a ladder
approximately 6 feet off of the ground, was struck by a metal beam
which caused him to fall off of the ladder, landing on his lower
back on a horizontal bar. Acute L1 compression fracture on earlier
CT.

EXAM:
CT CHEST, ABDOMEN, AND PELVIS WITH CONTRAST
TECHNIQUE: Multidetector CT imaging of the chest, abdomen and pelvis was
performed following the standard protocol during bolus
administration of intravenous contrast.
CONTRAST:  100mL OMNIPAQUE IOHEXOL 300 MG/ML IV.

[Series 201: cap with, idose (2) · axial · 0.72mm/px · z∈[+151,+666]mm · 14 of 123 slices shown, 18 images]
[im 10/123  mediastinal]
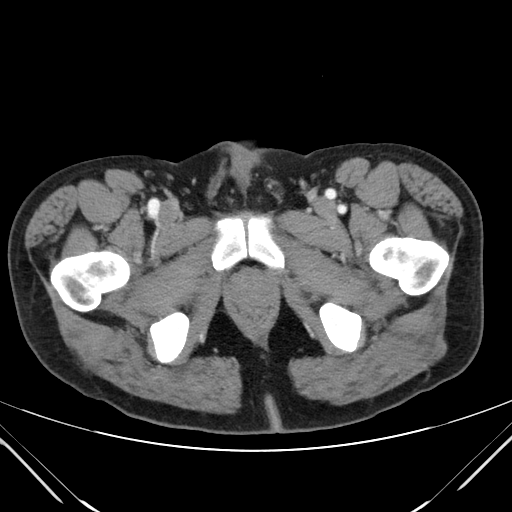
[im 10/123  lung]
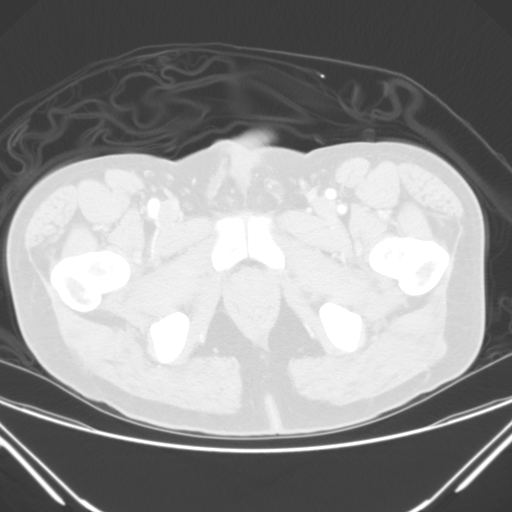
[im 19/123  lung]
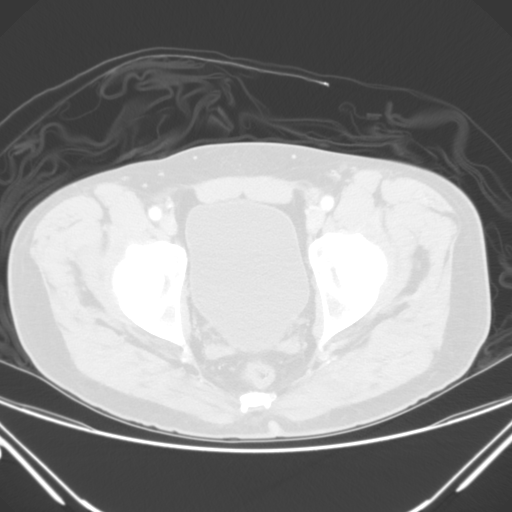
[im 25/123  lung]
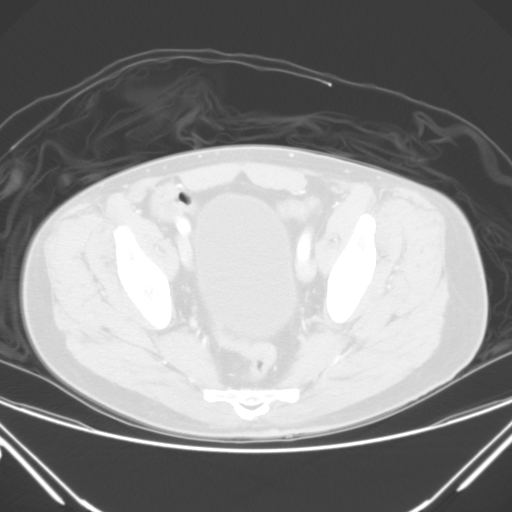
[im 32/123  lung]
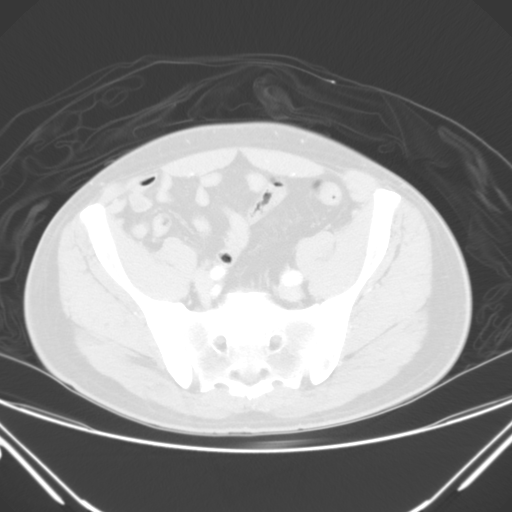
[im 41/123  mediastinal]
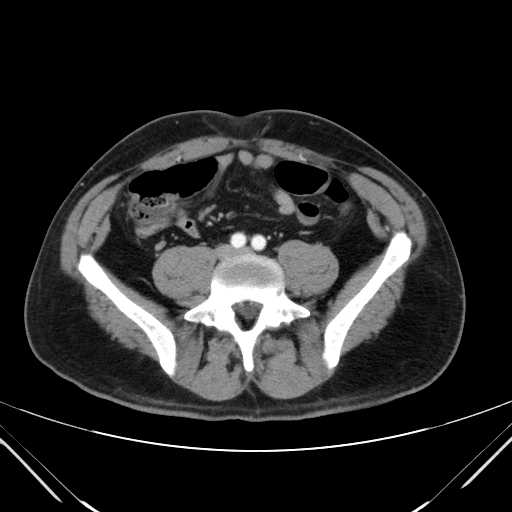
[im 41/123  lung]
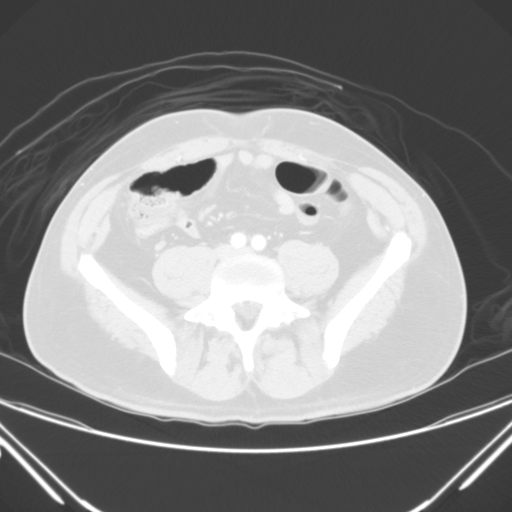
[im 50/123  lung]
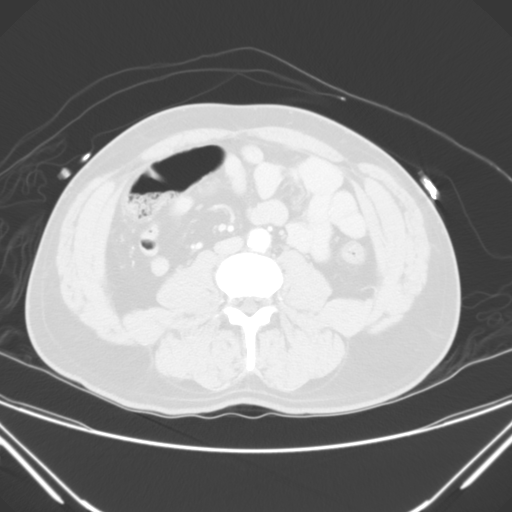
[im 59/123  lung]
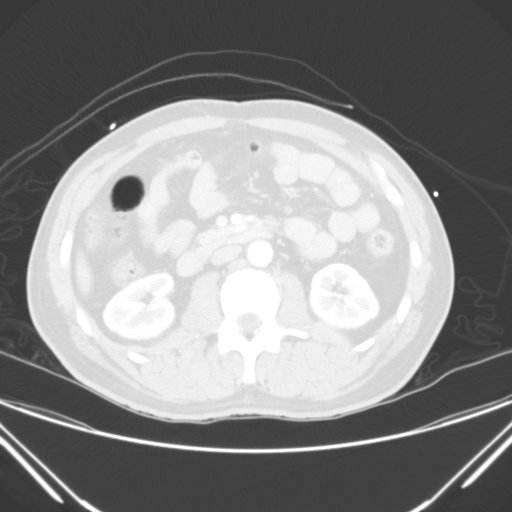
[im 64/123  lung]
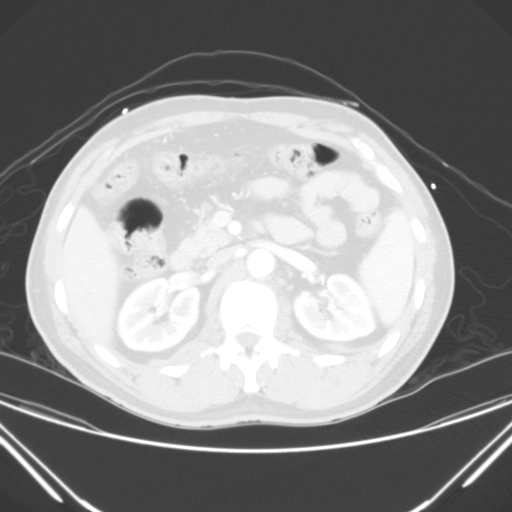
[im 73/123  mediastinal]
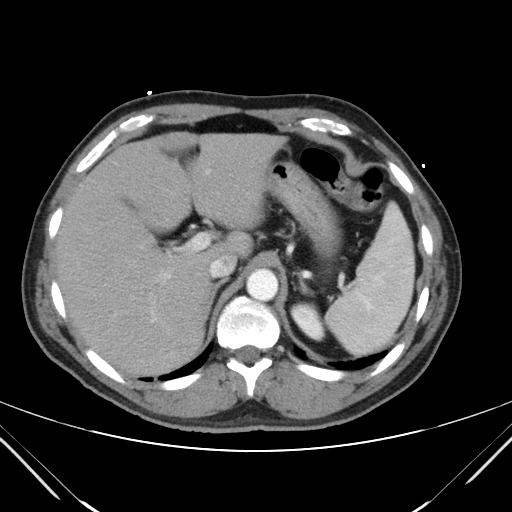
[im 73/123  lung]
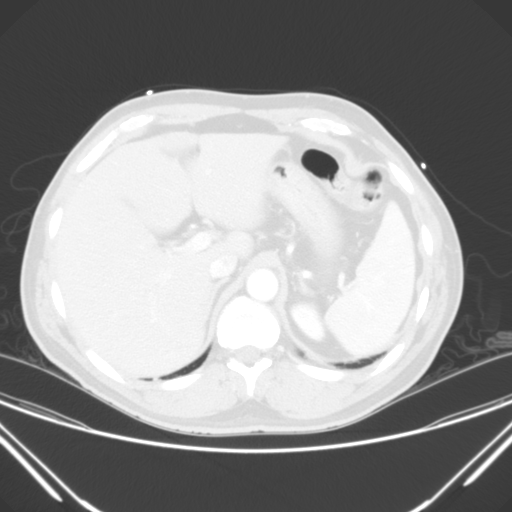
[im 82/123  lung]
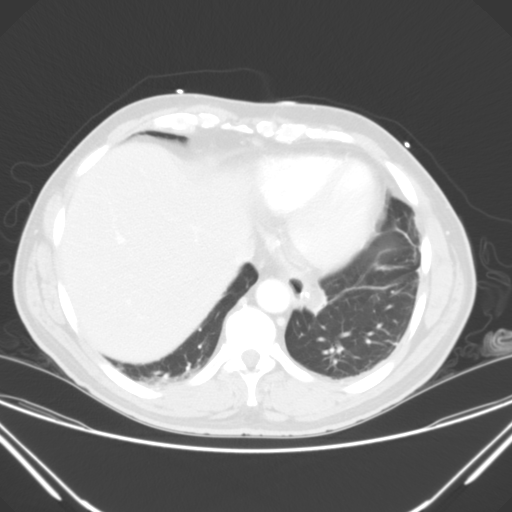
[im 91/123  lung]
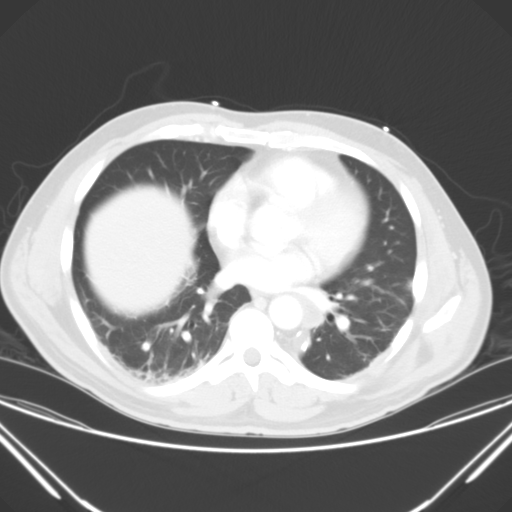
[im 98/123  lung]
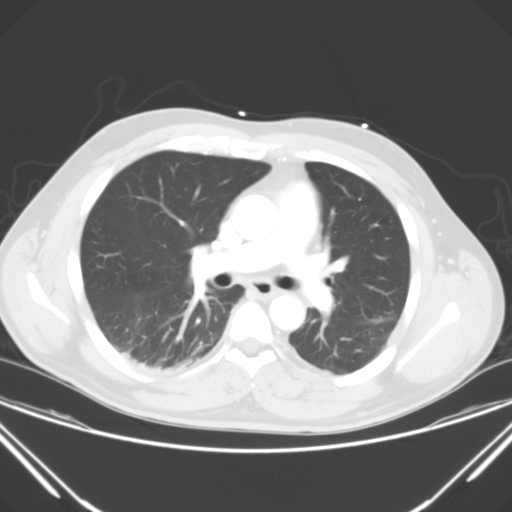
[im 104/123  mediastinal]
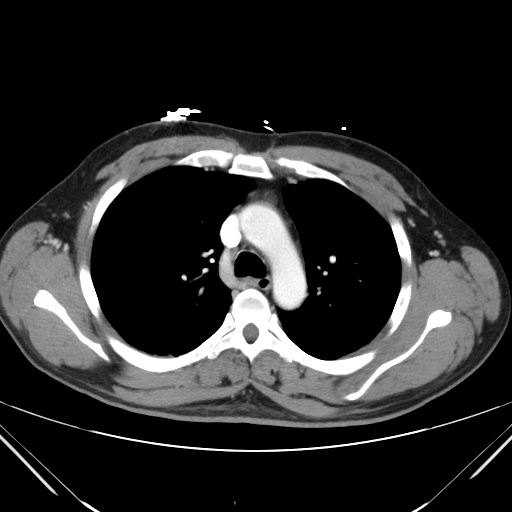
[im 104/123  lung]
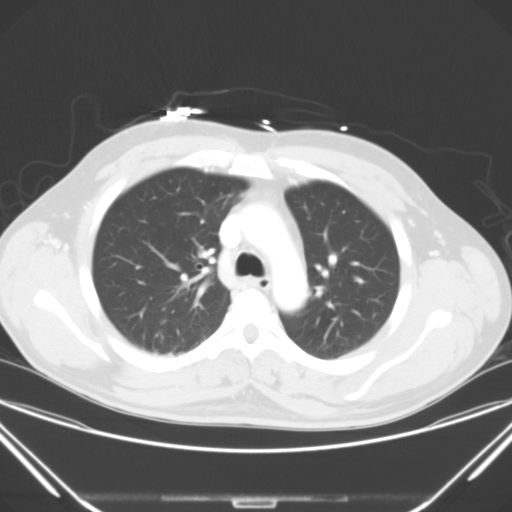
[im 113/123  lung]
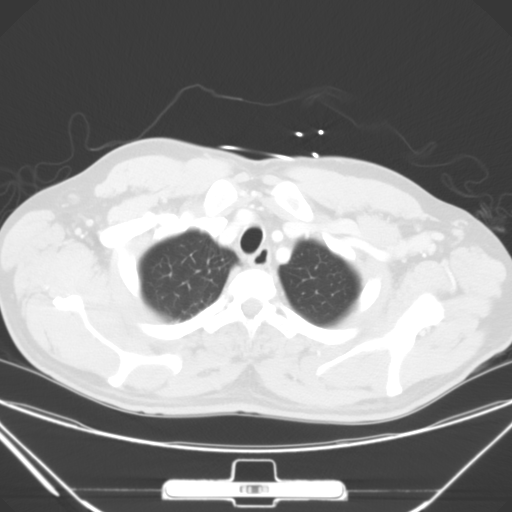

[14 of 33 positions shown; findings below may reference images not displayed]

FINDINGS: CT CHEST FINDINGS

Mediastinum/Lymph Nodes: No mediastinal hematoma. No pathologically
enlarged mediastinal, hilar or axillary lymph nodes. No mediastinal
masses. Normal-appearing esophagus. Visualized thyroid gland normal
in appearance. Surgical clips in the left posterior mediastinum with
likely dystrophic calcification posterior to the esophagus.

Cardiovascular: Heart size upper normal. No evidence of thoracic
aortic injury. No visible atherosclerosis. No pericardial effusion.

Lungs/Pleura: Pleuroparenchymal scarring at the left base. Elevation
of the right hemidiaphragm with scar/atelectasis involving the right
lower lobe. Mild dependent atelectasis posteriorly in the lower
lobes. Lungs otherwise clear without evidence of pulmonary contusion
or hematoma. No pulmonary parenchymal nodules or masses; subpleural
lymph node adjacent to the major fissure on the right. No pleural
effusions. Central airways patent.

Musculoskeletal: Post thoracotomy changes involving the left
posterior sixth and seventh ribs. No acute fractures involving the
bony thorax.

CT ABDOMEN PELVIS FINDINGS

Hepatobiliary: Mild diffuse hepatic steatosis without focal hepatic
parenchymal abnormality. Borderline a mild hepatomegaly. Gallbladder
normal in appearance without calcified gallstones. No biliary ductal
dilation.

Pancreas: Normal in appearance without evidence of mass, ductal
dilation, or inflammation.

Spleen: Normal in size and appearance.

Adrenals/Urinary Tract: Normal appearing adrenal glands. Kidneys
normal in size and appearance without focal parenchymal abnormality.
No evidence of urinary tract calculi or obstruction.
Normal-appearing urinary bladder.

Stomach/Bowel: Stomach normal in appearance for the degree of
distention. Normal-appearing small bowel. Entire colon decompressed
which I believe accounts for the apparent wall thickening diffusely.
Normal appendix in the right upper pelvis.

Vascular/Lymphatic: Mild calcified and noncalcified plaque involving
the distal abdominal aorta and common iliac arteries. Visceral
arteries widely patent.

Reproductive: Prostate gland and seminal vesicles normal in size and
appearance for age.

Other: No evidence of a situ or intraperitoneal hemorrhage. No
evidence of retroperitoneal hematoma.

Musculoskeletal: No acute fractures involving the bony pelvis.
IMPRESSION: 1. No evidence of acute traumatic visceral injury to the chest,
abdomen or pelvis.
2. Borderline hepatomegaly with mild diffuse hepatic steatosis. No
focal hepatic parenchymal abnormality.
3. Early mild distal abdominal aortic and bilateral common iliac
artery atherosclerosis, advanced for patient age.
4. Prior left thoracotomy with post surgical pleuroparenchymal
scarring at the left base.
5. Mild dependent atelectasis posteriorly in the lower lobes. No
acute cardiopulmonary disease otherwise.

## 2017-02-20 IMAGING — CT CT T SPINE W/O CM
2 series · 9 of 29 positions shown, 10 images · non-contrast
Comparison: None.

CLINICAL DATA: 44-year-old who was standing on a ladder
approximately 6 feet off of the ground, struck by Massayoshi Medrado which
caused him to fall off of the ladder, landing on his lower back on a
horizontal bar. Acute L1 compression fracture on earlier lumbar
spine CT.

EXAM:
CT CERVICAL SPINE WITHOUT CONTRAST
CT THORACIC SPINE WITHOUT CONTRAST
TECHNIQUE: Multidetector CT imaging of the cervical and thoracic spine was
performed without contrast. Multiplanar CT image reconstructions
were also generated.

[Series 304: sagittal soft , idose (2) · sagittal · 0.31mm/px · 5 of 67 slices shown, 6 images]
[im 27/67  bone]
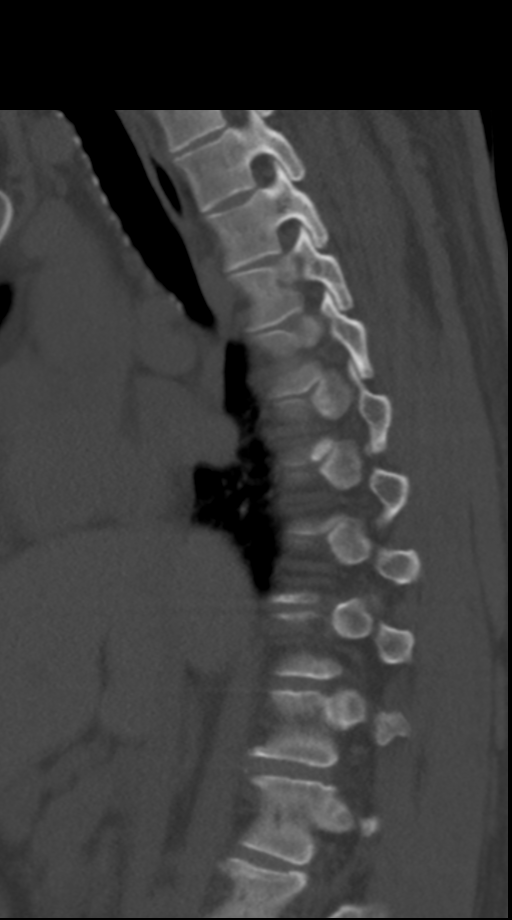
[im 28/67  bone]
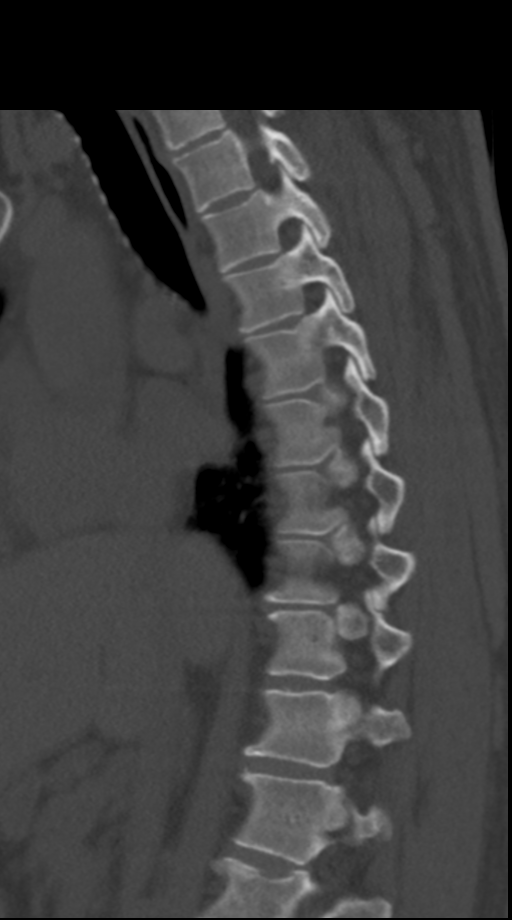
[im 34/67  soft-tissue]
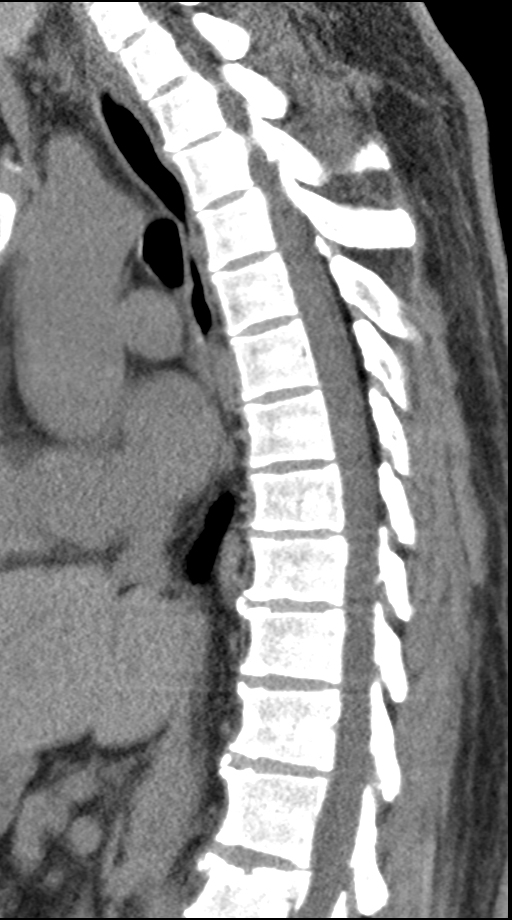
[im 34/67  bone]
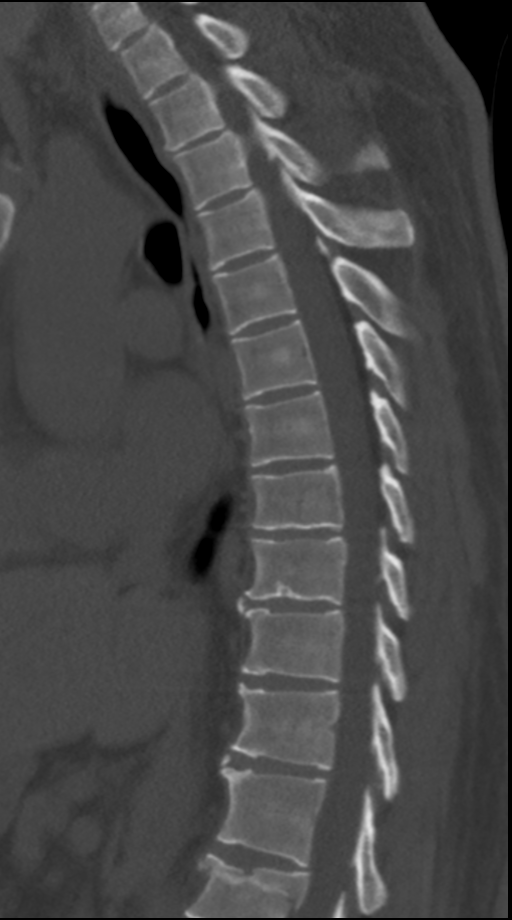
[im 39/67  bone]
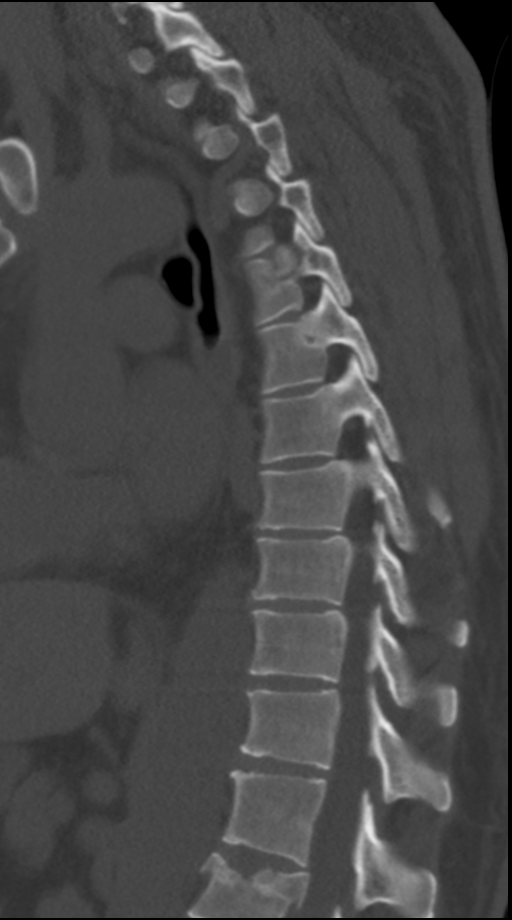
[im 40/67  bone]
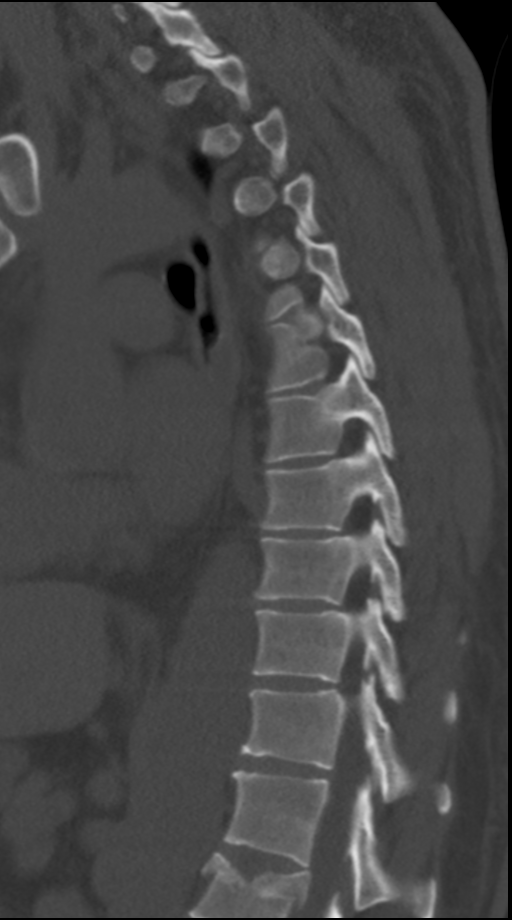

[Series 307: coronal, idose (2) · coronal · 0.31mm/px · 4 of 80 slices shown]
[im 16/80  bone]
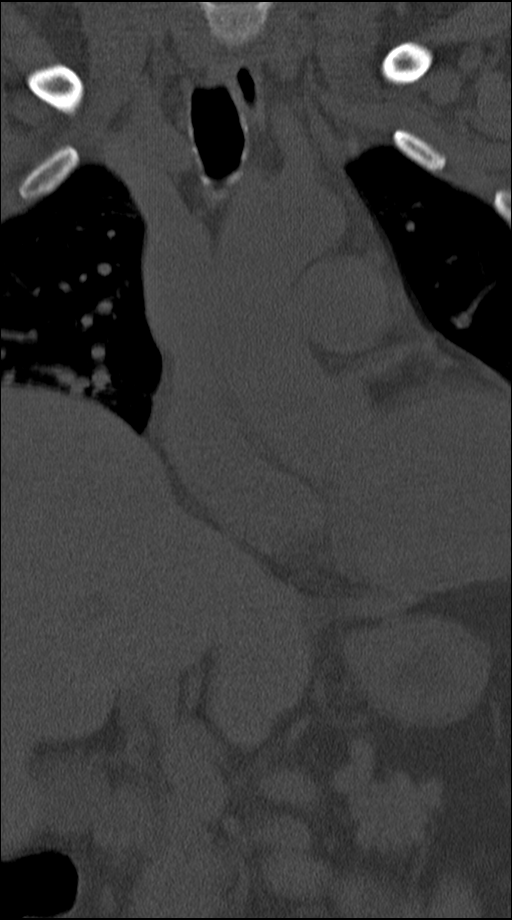
[im 32/80  bone]
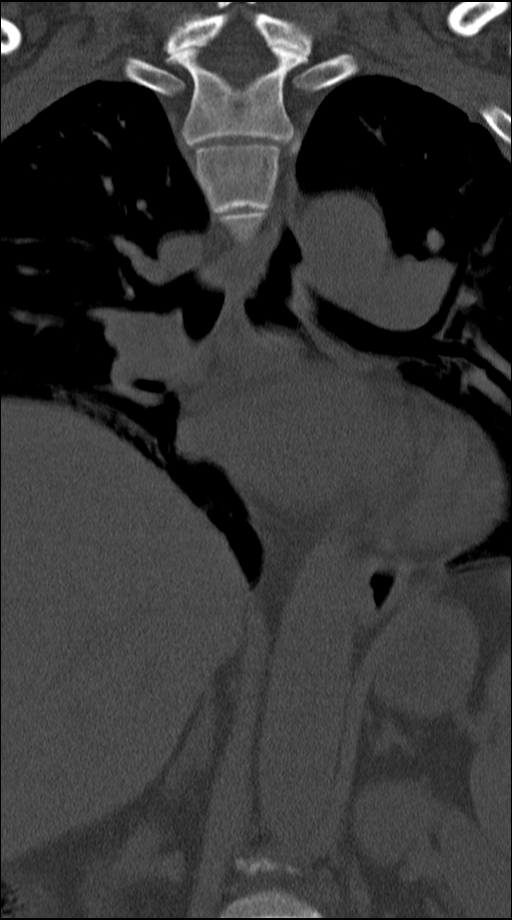
[im 48/80  bone]
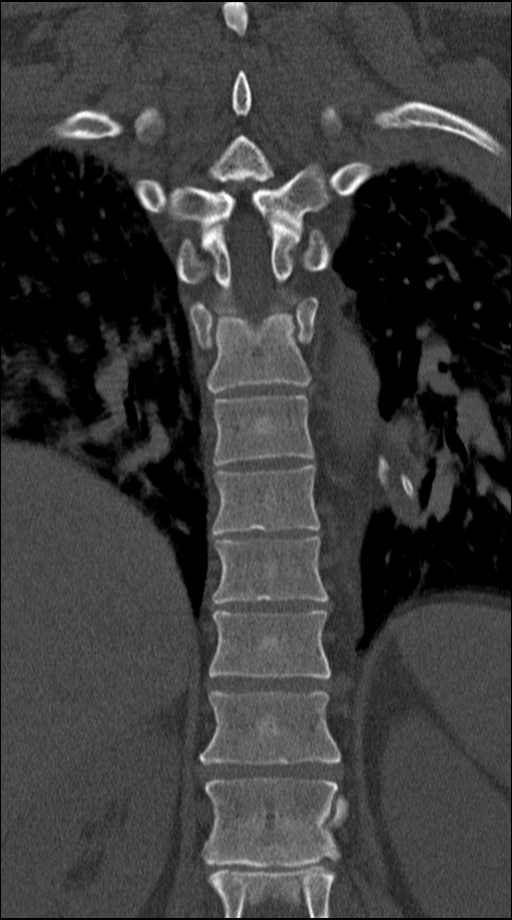
[im 64/80  bone]
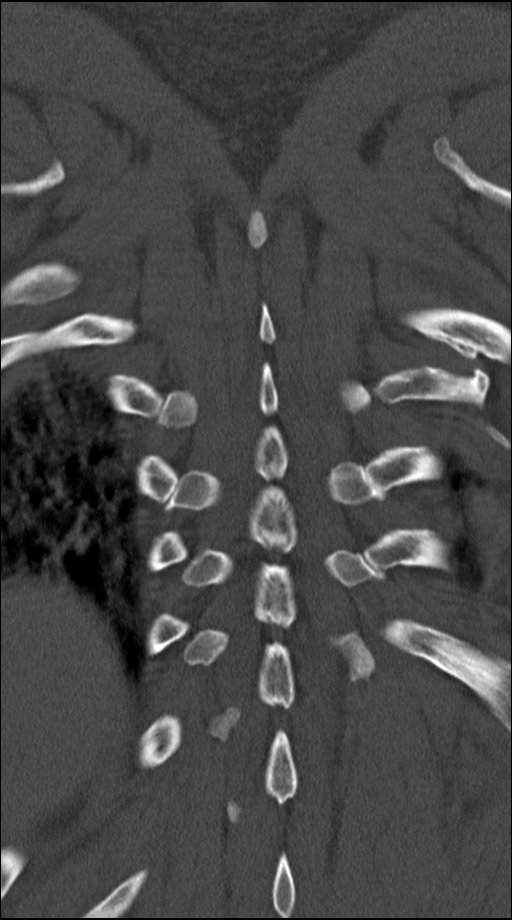

[9 of 29 positions shown; findings below may reference images not displayed]

FINDINGS: CT CERVICAL SPINE FINDINGS

No fractures identified involving the cervical spine. Sagittal
reconstructed images demonstrate anatomic alignment. Mild disc space
narrowing at C4-5 with mild central disc protrusion. Minimal central
disc protrusion at C3-4 and C5-6. Facet joints intact throughout. No
spinal stenosis. Coronal reformatted images demonstrate an intact
craniocervical junction, intact C1-C2 articulation, intact dens, and
intact lateral masses throughout. Uncinate hypertrophy accounts for
moderate bilateral foraminal stenoses at C4-5, with the remaining
neural foramina widely patent.

CT THORACIC SPINE FINDINGS

No fractures identified involving the thoracic spine. Several
reconstructed images demonstrate anatomic alignment. Mild disc space
narrowing at multiple levels of the lower thoracic spine.
Calcification in the anterior annular fibers at T9-10 and T11-12. No
spinal stenosis.

Likely postsurgical dystrophic calcification involving the posterior
mediastinum on the left, posterior to the esophagus.

Likely old thoracotomy deformity involving the posterior left
seventh rib. No acute fractures identified involving the visualized
posterior ribs.
IMPRESSION: 1. No acute fractures identified involving the cervical or thoracic
spine.
2. Old likely old thoracotomy deformity involving the posterior left
seventh rib. No acute fractures identified involving the visualized
posterior ribs.
